# Patient Record
Sex: Female | Born: 1960 | Race: White | Hispanic: No | Marital: Single | Smoking: Never smoker
Health system: Southern US, Community
[De-identification: ages and names within clinical notes are randomized; demographics above are authoritative.]

---

## 2017-05-06 ENCOUNTER — Emergency Department
Admission: EM | Admit: 2017-05-06 | Discharge: 2017-05-06 | Disposition: A | Discharge status: Other Acute Inpatient Hospital | Payer: BLUE CROSS/BLUE SHIELD | Attending: Emergency Medicine | Admitting: Emergency Medicine

## 2017-05-06 ENCOUNTER — Emergency Department: Payer: BLUE CROSS/BLUE SHIELD

## 2017-05-06 ENCOUNTER — Other Ambulatory Visit: Payer: Self-pay

## 2017-05-06 DIAGNOSIS — Y939 Activity, unspecified: Secondary | ICD-10-CM | POA: Diagnosis not present

## 2017-05-06 DIAGNOSIS — Y929 Unspecified place or not applicable: Secondary | ICD-10-CM | POA: Diagnosis not present

## 2017-05-06 DIAGNOSIS — S2220XA Unspecified fracture of sternum, initial encounter for closed fracture: Secondary | ICD-10-CM | POA: Diagnosis not present

## 2017-05-06 DIAGNOSIS — R1010 Upper abdominal pain, unspecified: Secondary | ICD-10-CM | POA: Diagnosis not present

## 2017-05-06 DIAGNOSIS — Y999 Unspecified external cause status: Secondary | ICD-10-CM | POA: Insufficient documentation

## 2017-05-06 DIAGNOSIS — S299XXA Unspecified injury of thorax, initial encounter: Secondary | ICD-10-CM | POA: Diagnosis present

## 2017-05-06 LAB — COMPREHENSIVE METABOLIC PANEL
ALK PHOS: 52 U/L (ref 38–126)
ALT: 20 U/L (ref 14–54)
AST: 25 U/L (ref 15–41)
Albumin: 4.1 g/dL (ref 3.5–5.0)
Anion gap: 7 (ref 5–15)
BUN: 14 mg/dL (ref 6–20)
CALCIUM: 9.2 mg/dL (ref 8.9–10.3)
CHLORIDE: 109 mmol/L (ref 101–111)
CO2: 24 mmol/L (ref 22–32)
CREATININE: 0.62 mg/dL (ref 0.44–1.00)
Glucose, Bld: 86 mg/dL (ref 65–99)
Potassium: 3.7 mmol/L (ref 3.5–5.1)
Sodium: 140 mmol/L (ref 135–145)
Total Bilirubin: 0.7 mg/dL (ref 0.3–1.2)
Total Protein: 8.1 g/dL (ref 6.5–8.1)

## 2017-05-06 LAB — CBC WITH DIFFERENTIAL/PLATELET
BASOS PCT: 0 %
Basophils Absolute: 0 10*3/uL (ref 0–0.1)
EOS ABS: 0.1 10*3/uL (ref 0–0.7)
EOS PCT: 1 %
HCT: 43 % (ref 35.0–47.0)
Hemoglobin: 14.3 g/dL (ref 12.0–16.0)
LYMPHS ABS: 1.8 10*3/uL (ref 1.0–3.6)
Lymphocytes Relative: 15 %
MCH: 32.2 pg (ref 26.0–34.0)
MCHC: 33.1 g/dL (ref 32.0–36.0)
MCV: 97.1 fL (ref 80.0–100.0)
Monocytes Absolute: 0.5 10*3/uL (ref 0.2–0.9)
Monocytes Relative: 5 %
NEUTROS PCT: 79 %
Neutro Abs: 9.2 10*3/uL — ABNORMAL HIGH (ref 1.4–6.5)
PLATELETS: 372 10*3/uL (ref 150–440)
RBC: 4.43 MIL/uL (ref 3.80–5.20)
RDW: 13.7 % (ref 11.5–14.5)
WBC: 11.6 10*3/uL — AB (ref 3.6–11.0)

## 2017-05-06 LAB — LIPASE, BLOOD: LIPASE: 53 U/L — AB (ref 11–51)

## 2017-05-06 LAB — TROPONIN I

## 2017-05-06 MED ORDER — IOPAMIDOL (ISOVUE-370) INJECTION 76%
100.0000 mL | Freq: Once | INTRAVENOUS | Status: AC | PRN
  Administered 2017-05-06: 100 mL via INTRAVENOUS
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
## 2017-05-06 NOTE — ED Notes (Signed)
EMTALA reviewed. 

## 2017-05-06 NOTE — ED Notes (Signed)
[facility] [employer]. Pt in stable condition upon D/C from the ED.

## 2017-05-06 NOTE — ED Notes (Signed)
Pt taken to CT at this time.
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
## 2017-05-06 NOTE — ED Notes (Signed)
This RN to bedside, pt given update regarding her results with MD permission. Pt states understanding. Denies any needs at this time. Will continue to monitor for further patient needs.
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
